# Patient Record
Sex: Male | Born: 2000 | Race: White | Hispanic: No | Marital: Single | Smoking: Current every day smoker
Health system: Southern US, Community
[De-identification: ages and names within clinical notes are randomized; demographics above are authoritative.]

---
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
## 2024-03-18 ENCOUNTER — Encounter (HOSPITAL_COMMUNITY): Payer: Self-pay

## 2024-03-18 ENCOUNTER — Emergency Department (HOSPITAL_COMMUNITY)
Admission: EM | Admit: 2024-03-18 | Discharge: 2024-03-19 | Disposition: A | Discharge status: Home / Self Care | Payer: MEDICAID | Attending: Emergency Medicine | Admitting: Emergency Medicine

## 2024-03-18 ENCOUNTER — Other Ambulatory Visit: Payer: Self-pay

## 2024-03-18 ENCOUNTER — Emergency Department (HOSPITAL_COMMUNITY): Payer: MEDICAID

## 2024-03-18 DIAGNOSIS — J039 Acute tonsillitis, unspecified: Secondary | ICD-10-CM | POA: Diagnosis not present

## 2024-03-18 DIAGNOSIS — J029 Acute pharyngitis, unspecified: Secondary | ICD-10-CM | POA: Diagnosis present

## 2024-03-18 MED ORDER — AMOXICILLIN 250 MG PO CAPS
500.0000 mg | ORAL_CAPSULE | Freq: Once | ORAL | Status: AC
  Administered 2024-03-19: 500 mg via ORAL
  Filled 2024-03-18: qty 2

## 2024-03-18 MED ORDER — PREDNISONE 50 MG PO TABS
60.0000 mg | ORAL_TABLET | Freq: Once | ORAL | Status: AC
  Administered 2024-03-19: 60 mg via ORAL
  Filled 2024-03-18: qty 1

## 2024-03-18 MED ORDER — AMOXICILLIN 500 MG PO CAPS: 500.0000 mg | ORAL_CAPSULE | Freq: Three times a day (TID) | ORAL | 0 refills | Status: AC

## 2024-03-18 NOTE — ED Triage Notes (Signed)
 Pt states that he has something suck in his throat and does not know if it is a chip or a parasite. Pt states that he sees it but not able to get it out.

## 2024-03-18 NOTE — ED Provider Notes (Signed)
 [place] EMERGENCY DEPARTMENT AT [facility] Provider Note   CSN: [id number] Arrival date & time: 03/18/24  2212     Patient presents with: Foreign Body in Throat   Douglas Stevens is a 23 y.o. male.   Patient presents to the emergency department for evaluation of 3 days of sore throat with no relief taking Tylenol and ibuprofen .  He reports that there is a white spot on the right side of his throat.       Prior to Admission medications   Medication Sig Start Date End Date Taking? Authorizing Provider  amoxicillin (AMOXIL) 500 MG capsule Take 1 capsule (500 mg total) by mouth 3 (three) times daily. 03/18/24  Yes Hong Timm, Marine Sia, MD  ibuprofen  (ADVIL ,MOTRIN ) 800 MG tablet Take 0.5 tablets (400 mg total) by mouth 3 (three) times daily. 10/15/15   Muthersbaugh, Alisa App, PA-C    Allergies: Patient has no allergy information on record.    Review of Systems  Updated Vital Signs BP 128/83 (BP Location: Right Arm)   Pulse 74   Temp 98.2 F (36.8 C) (Oral)   Resp 20   Ht 5' 11 (1.803 m)   SpO2 97%   Physical Exam Vitals and nursing note reviewed.  Constitutional:      General: He is not in acute distress.    Appearance: He is well-developed.  HENT:     Head: Normocephalic and atraumatic.     Mouth/Throat:     Mouth: Mucous membranes are moist.     Pharynx: Pharyngeal swelling and posterior oropharyngeal erythema present.     Tonsils: Tonsillar exudate present. No tonsillar abscesses.   Eyes:     General: Vision grossly intact. Gaze aligned appropriately.     Extraocular Movements: Extraocular movements intact.     Conjunctiva/sclera: Conjunctivae normal.    Cardiovascular:     Rate and Rhythm: Normal rate and regular rhythm.     Pulses: Normal pulses.     Heart sounds: Normal heart sounds, S1 normal and S2 normal. No murmur heard.    No friction rub. No gallop.  Pulmonary:     Effort: Pulmonary effort is normal. No respiratory distress.      Breath sounds: Normal breath sounds.  Abdominal:     Palpations: Abdomen is soft.     Tenderness: There is no abdominal tenderness. There is no guarding or rebound.     Hernia: No hernia is present.   Musculoskeletal:        General: No swelling.     Cervical back: Full passive range of motion without pain, normal range of motion and neck supple. No pain with movement, spinous process tenderness or muscular tenderness. Normal range of motion.     Right lower leg: No edema.     Left lower leg: No edema.   Skin:    General: Skin is warm and dry.     Capillary Refill: Capillary refill takes less than 2 seconds.     Findings: No ecchymosis, erythema, lesion or wound.   Neurological:     Mental Status: He is alert and oriented to person, place, and time.     GCS: GCS eye subscore is 4. GCS verbal subscore is 5. GCS motor subscore is 6.     Cranial Nerves: Cranial nerves 2-12 are intact.     Sensory: Sensation is intact.     Motor: Motor function is intact. No weakness or abnormal muscle tone.     Coordination: Coordination is  intact.   Psychiatric:        Mood and Affect: Mood normal.        Speech: Speech normal.        Behavior: Behavior normal.     (all labs ordered are listed, but only abnormal results are displayed) Labs Reviewed - No data to display  EKG: None  Radiology: DG Neck Soft Tissue Result Date: 03/18/2024 EXAM: 2 VIEW XRAY OF THE SOFT TISSUE NECK 03/18/2024 11:15:00 PM COMPARISON: None available. CLINICAL HISTORY: ?FB. 3 days: RT side back of throat region pain and swelling - ?foreign body? - ?FB. 3 days: RT side back of throat region pain and swelling - ?foreign body? FINDINGS: SOFT TISSUES: Unremarkable. No retropharyngeal soft tissue swelling or gas. EPIGLOTTIS: No epiglottic thickening. BONES: No acute osseous abnormality. IMPRESSION: 1. Negative exam. Electronically signed by: Zadie Herter MD 03/18/2024 11:27 PM EDT RP Workstation: [id number]      Procedures   Medications Ordered in the ED  predniSONE (DELTASONE) tablet 60 mg (has no administration in time range)  amoxicillin (AMOXIL) capsule 500 mg (has no administration in time range)                                    Medical Decision Making  Oropharyngeal Examination reveals erythema, slight swelling, exudate on the right tonsil.  No tonsillar enlargement or signs of abscess.     Final diagnoses:  Tonsillitis    ED Discharge Orders          Ordered    amoxicillin (AMOXIL) 500 MG capsule  3 times daily        03/18/24 2351               Ballard Bongo, MD 03/18/24 2351
